# Patient Record
Sex: Female | Born: 1975 | Race: White | Hispanic: No | Marital: Married | Smoking: Never smoker
Health system: Southern US, Community
[De-identification: ages and names within clinical notes are randomized; demographics above are authoritative.]

## PROBLEM LIST (undated history)

## (undated) DIAGNOSIS — E782 Mixed hyperlipidemia: Secondary | ICD-10-CM

## (undated) DIAGNOSIS — E669 Obesity, unspecified: Secondary | ICD-10-CM

## (undated) DIAGNOSIS — N200 Calculus of kidney: Secondary | ICD-10-CM

## (undated) DIAGNOSIS — Z720 Tobacco use: Secondary | ICD-10-CM

## (undated) DIAGNOSIS — C50919 Malignant neoplasm of unspecified site of unspecified female breast: Secondary | ICD-10-CM

## (undated) DIAGNOSIS — I10 Essential (primary) hypertension: Secondary | ICD-10-CM

## (undated) DIAGNOSIS — F419 Anxiety disorder, unspecified: Secondary | ICD-10-CM

## (undated) HISTORY — DX: Tobacco use: Z72.0

## (undated) HISTORY — PX: BREAST LUMPECTOMY: SHX2

## (undated) HISTORY — DX: Essential (primary) hypertension: I10

## (undated) HISTORY — PX: KIDNEY STONE SURGERY: SHX686

## (undated) HISTORY — DX: Obesity, unspecified: E66.9

## (undated) HISTORY — DX: Calculus of kidney: N20.0

## (undated) HISTORY — DX: Mixed hyperlipidemia: E78.2

## (undated) HISTORY — DX: Malignant neoplasm of unspecified site of unspecified female breast: C50.919

## (undated) HISTORY — DX: Anxiety disorder, unspecified: F41.9

---

## 2016-05-12 DIAGNOSIS — I10 Essential (primary) hypertension: Secondary | ICD-10-CM | POA: Diagnosis not present

## 2016-05-12 DIAGNOSIS — G4700 Insomnia, unspecified: Secondary | ICD-10-CM | POA: Diagnosis not present

## 2016-05-12 DIAGNOSIS — F411 Generalized anxiety disorder: Secondary | ICD-10-CM | POA: Diagnosis not present

## 2016-05-12 DIAGNOSIS — Z79899 Other long term (current) drug therapy: Secondary | ICD-10-CM | POA: Diagnosis not present

## 2016-05-12 DIAGNOSIS — E784 Other hyperlipidemia: Secondary | ICD-10-CM | POA: Diagnosis not present

## 2017-05-28 DIAGNOSIS — N200 Calculus of kidney: Secondary | ICD-10-CM | POA: Diagnosis not present

## 2017-06-07 DIAGNOSIS — N201 Calculus of ureter: Secondary | ICD-10-CM | POA: Diagnosis not present

## 2017-06-07 DIAGNOSIS — N3000 Acute cystitis without hematuria: Secondary | ICD-10-CM | POA: Diagnosis not present

## 2017-06-07 DIAGNOSIS — R100 Acute abdomen: Secondary | ICD-10-CM | POA: Diagnosis not present

## 2017-07-08 DIAGNOSIS — E669 Obesity, unspecified: Secondary | ICD-10-CM | POA: Diagnosis not present

## 2017-07-08 DIAGNOSIS — F419 Anxiety disorder, unspecified: Secondary | ICD-10-CM | POA: Diagnosis not present

## 2017-07-08 DIAGNOSIS — I10 Essential (primary) hypertension: Secondary | ICD-10-CM | POA: Diagnosis not present

## 2017-07-08 DIAGNOSIS — Z6837 Body mass index (BMI) 37.0-37.9, adult: Secondary | ICD-10-CM | POA: Diagnosis not present

## 2017-08-09 DIAGNOSIS — F329 Major depressive disorder, single episode, unspecified: Secondary | ICD-10-CM | POA: Diagnosis not present

## 2017-08-09 DIAGNOSIS — E669 Obesity, unspecified: Secondary | ICD-10-CM | POA: Diagnosis not present

## 2017-08-09 DIAGNOSIS — I10 Essential (primary) hypertension: Secondary | ICD-10-CM | POA: Diagnosis not present

## 2017-08-09 DIAGNOSIS — F17200 Nicotine dependence, unspecified, uncomplicated: Secondary | ICD-10-CM | POA: Diagnosis not present

## 2017-11-15 DIAGNOSIS — N200 Calculus of kidney: Secondary | ICD-10-CM | POA: Insufficient documentation

## 2017-11-15 DIAGNOSIS — R319 Hematuria, unspecified: Secondary | ICD-10-CM | POA: Diagnosis not present

## 2017-11-15 DIAGNOSIS — N201 Calculus of ureter: Secondary | ICD-10-CM | POA: Diagnosis not present

## 2017-11-15 DIAGNOSIS — R109 Unspecified abdominal pain: Secondary | ICD-10-CM | POA: Diagnosis not present

## 2017-11-15 HISTORY — DX: Hematuria, unspecified: R31.9

## 2017-11-15 HISTORY — DX: Unspecified abdominal pain: R10.9

## 2017-11-15 HISTORY — DX: Calculus of kidney: N20.0

## 2017-11-22 DIAGNOSIS — Z01818 Encounter for other preprocedural examination: Secondary | ICD-10-CM | POA: Diagnosis not present

## 2017-11-22 DIAGNOSIS — N1330 Unspecified hydronephrosis: Secondary | ICD-10-CM | POA: Diagnosis not present

## 2017-11-22 DIAGNOSIS — N200 Calculus of kidney: Secondary | ICD-10-CM | POA: Diagnosis not present

## 2017-11-23 DIAGNOSIS — N200 Calculus of kidney: Secondary | ICD-10-CM | POA: Diagnosis not present

## 2017-11-23 DIAGNOSIS — N201 Calculus of ureter: Secondary | ICD-10-CM | POA: Diagnosis not present

## 2017-12-06 DIAGNOSIS — R8290 Unspecified abnormal findings in urine: Secondary | ICD-10-CM | POA: Diagnosis not present

## 2017-12-06 DIAGNOSIS — N201 Calculus of ureter: Secondary | ICD-10-CM | POA: Diagnosis not present

## 2017-12-06 DIAGNOSIS — N200 Calculus of kidney: Secondary | ICD-10-CM | POA: Diagnosis not present

## 2017-12-21 DIAGNOSIS — N200 Calculus of kidney: Secondary | ICD-10-CM | POA: Diagnosis not present

## 2018-01-05 DIAGNOSIS — N200 Calculus of kidney: Secondary | ICD-10-CM | POA: Diagnosis not present

## 2018-01-10 DIAGNOSIS — N200 Calculus of kidney: Secondary | ICD-10-CM | POA: Diagnosis not present

## 2018-01-10 DIAGNOSIS — E785 Hyperlipidemia, unspecified: Secondary | ICD-10-CM | POA: Diagnosis not present

## 2018-01-10 DIAGNOSIS — F329 Major depressive disorder, single episode, unspecified: Secondary | ICD-10-CM | POA: Diagnosis not present

## 2018-01-10 DIAGNOSIS — F419 Anxiety disorder, unspecified: Secondary | ICD-10-CM | POA: Diagnosis not present

## 2018-01-11 DIAGNOSIS — E785 Hyperlipidemia, unspecified: Secondary | ICD-10-CM | POA: Diagnosis not present

## 2018-02-18 DIAGNOSIS — Z6836 Body mass index (BMI) 36.0-36.9, adult: Secondary | ICD-10-CM | POA: Diagnosis not present

## 2018-02-18 DIAGNOSIS — E669 Obesity, unspecified: Secondary | ICD-10-CM | POA: Diagnosis not present

## 2018-03-11 DIAGNOSIS — G43109 Migraine with aura, not intractable, without status migrainosus: Secondary | ICD-10-CM | POA: Diagnosis not present

## 2018-04-11 DIAGNOSIS — N200 Calculus of kidney: Secondary | ICD-10-CM | POA: Diagnosis not present

## 2018-04-12 DIAGNOSIS — N200 Calculus of kidney: Secondary | ICD-10-CM | POA: Diagnosis not present

## 2018-05-18 DIAGNOSIS — N200 Calculus of kidney: Secondary | ICD-10-CM | POA: Diagnosis not present

## 2018-06-07 DIAGNOSIS — E669 Obesity, unspecified: Secondary | ICD-10-CM | POA: Diagnosis not present

## 2018-06-07 DIAGNOSIS — F322 Major depressive disorder, single episode, severe without psychotic features: Secondary | ICD-10-CM | POA: Diagnosis not present

## 2018-06-07 DIAGNOSIS — Z6836 Body mass index (BMI) 36.0-36.9, adult: Secondary | ICD-10-CM | POA: Diagnosis not present

## 2018-06-07 DIAGNOSIS — I10 Essential (primary) hypertension: Secondary | ICD-10-CM | POA: Diagnosis not present

## 2018-06-20 DIAGNOSIS — Z1231 Encounter for screening mammogram for malignant neoplasm of breast: Secondary | ICD-10-CM | POA: Diagnosis not present

## 2018-07-05 DIAGNOSIS — F329 Major depressive disorder, single episode, unspecified: Secondary | ICD-10-CM | POA: Diagnosis not present

## 2018-07-05 DIAGNOSIS — E669 Obesity, unspecified: Secondary | ICD-10-CM | POA: Diagnosis not present

## 2018-07-05 DIAGNOSIS — Z6836 Body mass index (BMI) 36.0-36.9, adult: Secondary | ICD-10-CM | POA: Diagnosis not present

## 2018-09-21 DIAGNOSIS — J329 Chronic sinusitis, unspecified: Secondary | ICD-10-CM | POA: Diagnosis not present

## 2018-12-12 DIAGNOSIS — R079 Chest pain, unspecified: Secondary | ICD-10-CM | POA: Diagnosis not present

## 2018-12-12 DIAGNOSIS — E785 Hyperlipidemia, unspecified: Secondary | ICD-10-CM | POA: Diagnosis not present

## 2018-12-12 DIAGNOSIS — F411 Generalized anxiety disorder: Secondary | ICD-10-CM | POA: Diagnosis not present

## 2018-12-12 DIAGNOSIS — Z87891 Personal history of nicotine dependence: Secondary | ICD-10-CM | POA: Diagnosis not present

## 2018-12-12 DIAGNOSIS — Z720 Tobacco use: Secondary | ICD-10-CM | POA: Diagnosis not present

## 2019-02-14 DIAGNOSIS — R0789 Other chest pain: Secondary | ICD-10-CM | POA: Diagnosis not present

## 2019-02-14 DIAGNOSIS — F419 Anxiety disorder, unspecified: Secondary | ICD-10-CM | POA: Diagnosis not present

## 2019-02-15 DIAGNOSIS — R0789 Other chest pain: Secondary | ICD-10-CM | POA: Diagnosis not present

## 2019-05-23 DIAGNOSIS — F331 Major depressive disorder, recurrent, moderate: Secondary | ICD-10-CM | POA: Diagnosis not present

## 2019-05-23 DIAGNOSIS — F419 Anxiety disorder, unspecified: Secondary | ICD-10-CM | POA: Diagnosis not present

## 2019-06-26 DIAGNOSIS — M546 Pain in thoracic spine: Secondary | ICD-10-CM | POA: Diagnosis not present

## 2019-06-26 DIAGNOSIS — T148XXA Other injury of unspecified body region, initial encounter: Secondary | ICD-10-CM | POA: Diagnosis not present

## 2019-07-17 DIAGNOSIS — H9209 Otalgia, unspecified ear: Secondary | ICD-10-CM | POA: Diagnosis not present

## 2019-07-17 DIAGNOSIS — R599 Enlarged lymph nodes, unspecified: Secondary | ICD-10-CM | POA: Diagnosis not present

## 2019-07-17 DIAGNOSIS — H6690 Otitis media, unspecified, unspecified ear: Secondary | ICD-10-CM | POA: Diagnosis not present

## 2019-07-20 DIAGNOSIS — Z1231 Encounter for screening mammogram for malignant neoplasm of breast: Secondary | ICD-10-CM | POA: Diagnosis not present

## 2019-07-20 DIAGNOSIS — Z1239 Encounter for other screening for malignant neoplasm of breast: Secondary | ICD-10-CM | POA: Diagnosis not present

## 2019-07-21 DIAGNOSIS — Z20828 Contact with and (suspected) exposure to other viral communicable diseases: Secondary | ICD-10-CM | POA: Diagnosis not present

## 2019-08-14 DIAGNOSIS — K30 Functional dyspepsia: Secondary | ICD-10-CM | POA: Diagnosis not present

## 2019-08-14 DIAGNOSIS — R1013 Epigastric pain: Secondary | ICD-10-CM | POA: Diagnosis not present

## 2019-08-14 DIAGNOSIS — I10 Essential (primary) hypertension: Secondary | ICD-10-CM | POA: Diagnosis not present

## 2019-08-18 DIAGNOSIS — Z20828 Contact with and (suspected) exposure to other viral communicable diseases: Secondary | ICD-10-CM | POA: Diagnosis not present

## 2019-08-25 DIAGNOSIS — Z0000 Encounter for general adult medical examination without abnormal findings: Secondary | ICD-10-CM | POA: Diagnosis not present

## 2019-08-25 DIAGNOSIS — N200 Calculus of kidney: Secondary | ICD-10-CM | POA: Diagnosis not present

## 2019-08-25 DIAGNOSIS — E785 Hyperlipidemia, unspecified: Secondary | ICD-10-CM | POA: Diagnosis not present

## 2019-08-25 DIAGNOSIS — Z87891 Personal history of nicotine dependence: Secondary | ICD-10-CM | POA: Diagnosis not present

## 2019-08-25 DIAGNOSIS — I10 Essential (primary) hypertension: Secondary | ICD-10-CM | POA: Diagnosis not present

## 2019-08-25 DIAGNOSIS — E669 Obesity, unspecified: Secondary | ICD-10-CM | POA: Diagnosis not present

## 2019-08-25 DIAGNOSIS — Z1331 Encounter for screening for depression: Secondary | ICD-10-CM | POA: Diagnosis not present

## 2019-08-29 DIAGNOSIS — Z20822 Contact with and (suspected) exposure to covid-19: Secondary | ICD-10-CM | POA: Diagnosis not present

## 2019-09-28 DIAGNOSIS — D225 Melanocytic nevi of trunk: Secondary | ICD-10-CM | POA: Diagnosis not present

## 2019-09-28 DIAGNOSIS — D2239 Melanocytic nevi of other parts of face: Secondary | ICD-10-CM | POA: Diagnosis not present

## 2019-09-28 DIAGNOSIS — L918 Other hypertrophic disorders of the skin: Secondary | ICD-10-CM | POA: Diagnosis not present

## 2019-09-28 DIAGNOSIS — D1801 Hemangioma of skin and subcutaneous tissue: Secondary | ICD-10-CM | POA: Diagnosis not present

## 2019-09-28 DIAGNOSIS — D485 Neoplasm of uncertain behavior of skin: Secondary | ICD-10-CM | POA: Diagnosis not present

## 2019-11-03 DIAGNOSIS — F411 Generalized anxiety disorder: Secondary | ICD-10-CM | POA: Diagnosis not present

## 2020-01-09 DIAGNOSIS — S39012A Strain of muscle, fascia and tendon of lower back, initial encounter: Secondary | ICD-10-CM | POA: Diagnosis not present

## 2020-03-12 DIAGNOSIS — F411 Generalized anxiety disorder: Secondary | ICD-10-CM | POA: Diagnosis not present

## 2020-03-13 DIAGNOSIS — E785 Hyperlipidemia, unspecified: Secondary | ICD-10-CM | POA: Diagnosis not present

## 2020-04-11 DIAGNOSIS — R112 Nausea with vomiting, unspecified: Secondary | ICD-10-CM | POA: Diagnosis not present

## 2020-04-11 DIAGNOSIS — N200 Calculus of kidney: Secondary | ICD-10-CM | POA: Diagnosis not present

## 2020-07-22 DIAGNOSIS — Z1239 Encounter for other screening for malignant neoplasm of breast: Secondary | ICD-10-CM | POA: Diagnosis not present

## 2020-07-22 DIAGNOSIS — Z1231 Encounter for screening mammogram for malignant neoplasm of breast: Secondary | ICD-10-CM | POA: Diagnosis not present

## 2020-07-23 DIAGNOSIS — Z1151 Encounter for screening for human papillomavirus (HPV): Secondary | ICD-10-CM | POA: Diagnosis not present

## 2020-07-23 DIAGNOSIS — N938 Other specified abnormal uterine and vaginal bleeding: Secondary | ICD-10-CM | POA: Diagnosis not present

## 2020-07-23 DIAGNOSIS — Z3202 Encounter for pregnancy test, result negative: Secondary | ICD-10-CM | POA: Diagnosis not present

## 2020-07-23 DIAGNOSIS — Z853 Personal history of malignant neoplasm of breast: Secondary | ICD-10-CM | POA: Diagnosis not present

## 2020-07-23 DIAGNOSIS — Z01419 Encounter for gynecological examination (general) (routine) without abnormal findings: Secondary | ICD-10-CM | POA: Diagnosis not present

## 2020-07-28 DIAGNOSIS — U071 COVID-19: Secondary | ICD-10-CM | POA: Diagnosis not present

## 2020-09-24 DIAGNOSIS — F411 Generalized anxiety disorder: Secondary | ICD-10-CM | POA: Diagnosis not present

## 2020-10-28 DIAGNOSIS — M25511 Pain in right shoulder: Secondary | ICD-10-CM | POA: Diagnosis not present

## 2020-11-13 DIAGNOSIS — F411 Generalized anxiety disorder: Secondary | ICD-10-CM | POA: Diagnosis not present

## 2020-11-13 DIAGNOSIS — F410 Panic disorder [episodic paroxysmal anxiety] without agoraphobia: Secondary | ICD-10-CM | POA: Diagnosis not present

## 2020-12-31 DIAGNOSIS — M25552 Pain in left hip: Secondary | ICD-10-CM | POA: Diagnosis not present

## 2020-12-31 DIAGNOSIS — S76012A Strain of muscle, fascia and tendon of left hip, initial encounter: Secondary | ICD-10-CM | POA: Diagnosis not present

## 2021-01-09 ENCOUNTER — Encounter: Payer: Self-pay | Admitting: *Deleted

## 2021-01-09 ENCOUNTER — Encounter: Payer: Self-pay | Admitting: Cardiology

## 2021-01-10 ENCOUNTER — Other Ambulatory Visit: Payer: Self-pay

## 2021-01-10 ENCOUNTER — Ambulatory Visit (INDEPENDENT_AMBULATORY_CARE_PROVIDER_SITE_OTHER): Payer: BC Managed Care – PPO

## 2021-01-10 ENCOUNTER — Encounter: Payer: Self-pay | Admitting: Cardiology

## 2021-01-10 ENCOUNTER — Ambulatory Visit: Payer: BC Managed Care – PPO | Admitting: Cardiology

## 2021-01-10 VITALS — BP 126/80 | HR 66 | Ht 63.0 in | Wt 219.8 lb

## 2021-01-10 DIAGNOSIS — R079 Chest pain, unspecified: Secondary | ICD-10-CM

## 2021-01-10 DIAGNOSIS — E782 Mixed hyperlipidemia: Secondary | ICD-10-CM

## 2021-01-10 DIAGNOSIS — R011 Cardiac murmur, unspecified: Secondary | ICD-10-CM | POA: Insufficient documentation

## 2021-01-10 DIAGNOSIS — R0609 Other forms of dyspnea: Secondary | ICD-10-CM

## 2021-01-10 DIAGNOSIS — R0600 Dyspnea, unspecified: Secondary | ICD-10-CM

## 2021-01-10 DIAGNOSIS — R002 Palpitations: Secondary | ICD-10-CM | POA: Diagnosis not present

## 2021-01-10 DIAGNOSIS — E669 Obesity, unspecified: Secondary | ICD-10-CM | POA: Insufficient documentation

## 2021-01-10 DIAGNOSIS — I10 Essential (primary) hypertension: Secondary | ICD-10-CM

## 2021-01-10 HISTORY — DX: Chest pain, unspecified: R07.9

## 2021-01-10 HISTORY — DX: Obesity, unspecified: E66.9

## 2021-01-10 HISTORY — DX: Cardiac murmur, unspecified: R01.1

## 2021-01-10 HISTORY — DX: Other forms of dyspnea: R06.09

## 2021-01-10 HISTORY — DX: Dyspnea, unspecified: R06.00

## 2021-01-10 NOTE — Addendum Note (Signed)
Addended by: Berniece Salines on: 01/10/2021 10:00 AM   Modules accepted: Orders

## 2021-01-10 NOTE — Patient Instructions (Signed)
Medication Instructions:  Your physician recommends that you continue on your current medications as directed. Please refer to the Current Medication list given to you today.  *If you need a refill on your cardiac medications before your next appointment, please call your pharmacy*   Lab Work: None If you have labs (blood work) drawn today and your tests are completely normal, you will receive your results only by: Marland Kitchen MyChart Message (if you have MyChart) OR . A paper copy in the mail If you have any lab test that is abnormal or we need to change your treatment, we will call you to review the results.   Testing/Procedures: A zio monitor was ordered today. It will remain on for 7 days. You will then return monitor and event diary in provided box. It takes 1-2 weeks for report to be downloaded and returned to [place]. We will call you with the results. If monitor falls off or has orange flashing light, please call Zio for further instructions.    Your physician has requested that you have a stress echocardiogram. For further information please visit [email or web address]. Please follow instruction sheet as given.    Follow-Up: At [facility], you and your health needs are our priority.  As part of our continuing mission to provide you with exceptional heart care, we have created designated Provider Care Teams.  These Care Teams include your primary Cardiologist (physician) and Advanced Practice Providers (APPs -  Physician Assistants and Nurse Practitioners) who all work together to provide you with the care you need, when you need it.  We recommend signing up for the patient portal called "MyChart".  Sign up information is provided on this After Visit Summary.  MyChart is used to connect with patients for Virtual Visits (Telemedicine).  Patients are able to view lab/test results, encounter notes, upcoming appointments, etc.  Non-urgent messages can be sent to your provider as well.   To learn  more about what you can do with MyChart, go to [email or web address].    Your next appointment:   3 month(s)  The format for your next appointment:   In Person  Provider:   Berniece Salines, DO   Other Instructions   Exercise Stress Echocardiogram An exercise stress echocardiogram is a test to check how well your heart is working. This test uses sound waves (ultrasound) and a computer to make images of your heart before and after exercise. Ultrasound images that are taken before you exercise (resting echocardiogram) will show how much blood is getting to your heart muscle and how well your heart muscle and heart valves are functioning. During the next part of this test, you will walk on a treadmill or ride a stationary bicycle to see how exercise affects your heart. While you exercise, the electrical activity of your heart will be monitored with an electrocardiogram (ECG). Your blood pressure will also be monitored. You may have this test if you have:  Chest pain or other symptoms of a heart problem.  Recently had a heart attack or heart surgery.  Heart valve problems.  A condition that causes narrowing of the blood vessels that supply your heart (coronary artery disease).  A high risk of heart disease and are starting a new exercise program.  A high risk of heart disease and need to have major surgery.  Heart arrhythmia (abnormal heart rhythm) problems.  Heart failure problems. Tell a health care provider about:  Any allergies you have.  All medicines you are taking, including vitamins, herbs,  eye drops, creams, and over-the-counter medicines.  Any problems you or family members have had with anesthetic medicines.  Any surgeries you have had.  Any blood disorders you have.  Any medical conditions you have.  Whether you are pregnant or may be pregnant. What are the risks? Generally, this is a safe test. However, problems may occur, including:  Chest  pain.  Dizziness or light-headedness.  Shortness of breath.  Increased or irregular heartbeat (palpitations).  Nausea or vomiting.  Heart attack. This is very rare. What happens before the test? Medicines  Ask your health care provider about changing or stopping your regular medicines. This is especially important if you are taking diabetes medicines or blood thinners.  If you use an inhaler, bring it with you to the test. General instructions  Wear loose, comfortable clothing and walking shoes.  Follow instructions from your health care provider about eating or drinking restrictions. You may be asked to avoid all forms of caffeine for 24 hours before your test, or as told by your health care provider.  Do not use any products that contain nicotine or tobacco for 4 hours before the test, or as told by your health care provider. These products include cigarettes, chewing tobacco, and vaping devices, such as e-cigarettes. If you need help quitting, ask your health care provider. What happens during the test?  You will take off your clothes from the waist up and put on a hospital gown.  Electrodes or electrocardiogram (ECG) patches may be placed on your chest. The electrodes or patches are then connected to a device that monitors your heart rate and rhythm.  A blood pressure cuff will be placed on your arm.  You will lie down on a table for an ultrasound exam before you exercise. A gel will be applied to your chest to help sound waves pass through your skin.  A handheld device, called a transducer, will be pressed against your chest and moved over your heart. The transducer produces sound waves that travel to your heart and bounce back (or "echo" back) to the transducer. These sound waves will be captured in real-time and changed into images of your heart that can be viewed on a video monitor. The images will be recorded on a computer and reviewed by your health care provider.  Once  the ultrasound is complete, you will start exercising by walking on a treadmill or pedaling a stationary bicycle.  Your blood pressure and heart rhythm will be monitored while you exercise.  The exercise will gradually get harder or faster.  You will exercise until: ? Your heart reaches a target level. ? You are too tired to continue. ? You cannot continue because of chest pain, weakness, or dizziness.  You will have another ultrasound exam immediately after you stop exercising. The procedure may vary among health care providers and hospitals.   What can I expect after the test? After your test, it is common to have:  Mild soreness.  Mild fatigue. Your heart rate and blood pressure will be monitored until they return to your normal levels. You should not have any new symptoms after this test. Follow these instructions at home:  After your stress test, you should be able to return to your normal activities and diet.  Take over-the-counter and prescription medicines only as told by your health care provider.  Keep all follow-up visits. This is important.  It is up to you to get the results of your test. Ask your health  care provider, or the department that is doing the test, when your results will be ready. Contact a health care provider if you:  Feel dizzy or light-headed.  Have a fast or irregular heartbeat.  Have nausea or vomiting.  Have a headache.  Feel short of breath. Get help right away if you:  Develop pain or pressure: ? In your chest. ? In your jaw or neck. ? Between your shoulder blades. ? Radiating down your left arm.  Faint.  Have trouble breathing. These symptoms may represent a serious problem that is an emergency. Do not wait to see if the symptoms will go away. Get medical help right away. Call your local emergency services (911 in the [place] Do not drive yourself to the hospital. Summary  An exercise stress echocardiogram is a test that uses  ultrasound to check how well your heart works before and after exercise.  Before the test, follow instructions from your health care provider about stopping medicines and avoiding caffeine, nicotine and tobacco, and certain foods and drinks.  During the test, your blood pressure and heart rhythm will be monitored while you exercise on a treadmill or stationary bicycle. This information is not intended to replace advice given to you by your health care provider. Make sure you discuss any questions you have with your health care provider. Document Revised: 03/19/2020 Document Reviewed: 03/19/2020 Elsevier Patient Education  2021 [employer].

## 2021-01-10 NOTE — Progress Notes (Signed)
Cardiology Office Note:    Date:  01/10/2021   ID:  WEI NEWBROUGH, DOB 1976/04/06, MRN [id number]  PCP:  Jaclynn Major, NP  Cardiologist:  Berniece Salines, DO  Electrophysiologist:  None   Referring MD: Jaclynn Major, NP   " I have been experiencing chest pain and palpitations"  History of Present Illness:    Kristen Russell is a 45 y.o. female with a hx of breast cancer at the age of 74, and status post right lumpectomy and radiation, hypertension, hyperlipidemia, obesity, former smoker quit November 2021 after over many years of smoking, family history of premature coronary artery disease in her dad is here today to be evaluated for chest discomfort.  Patient tells me she has been experiencing intermittent left-sided chest pain.  She notes that it feels like someone is pressing on her chest.  She said the last for few minutes and then resolves on its own.  She notes that at times this is a sharp pain.  She has no recollection of radiation.  She does have associated shortness of breath.  In addition she has intermittent abrupt onset of fast heartbeat which last for few minutes and then resolved.  Recently she has been experiencing lightheadedness with these episodes.  Past Medical History:  Diagnosis Date  . Anxiety   . Breast cancer ([place])   . Calculus of kidney   . Hematuria 11/15/2017  . Hypertension   . Mixed hyperlipidemia   . Nephrolithiasis 11/15/2017  . Obesity   . Right flank pain 11/15/2017  . Tobacco abuse     Past Surgical History:  Procedure Laterality Date  . BREAST LUMPECTOMY Right   . CESAREAN SECTION     X2  . KIDNEY STONE SURGERY     X2    Current Medications: Current Meds  Medication Sig  . atenolol (TENORMIN) 25 MG tablet Take 12.5 mg by mouth daily.  . clonazePAM (KLONOPIN) 0.5 MG tablet Take 0.5 mg by mouth 2 (two) times daily as needed for anxiety.  Stasia Cavalier (EUCRISA) 2 % OINT Apply 1 application topically 2 (two) times daily.  .  cyclobenzaprine (FLEXERIL) 5 MG tablet Take 5 mg by mouth 2 (two) times daily as needed for muscle spasms.  . hydrochlorothiazide (MICROZIDE) 12.5 MG capsule Take 12.5 mg by mouth daily.  Marland Kitchen omeprazole (PRILOSEC) 20 MG capsule Take 20 mg by mouth daily.  . pimecrolimus (ELIDEL) 1 % cream Apply 1 application topically 2 (two) times daily.  . rosuvastatin (CRESTOR) 10 MG tablet Take 10 mg by mouth at bedtime.  . Semaglutide-Weight Management (WEGOVY) 1 MG/0.5ML SOAJ Inject 0.5 mLs into the skin once a week.  . sertraline (ZOLOFT) 100 MG tablet Take 100 mg by mouth daily.     Allergies:   Penicillin g   Social History   Socioeconomic History  . Marital status: Married    Spouse name: Not on file  . Number of children: Not on file  . Years of education: Not on file  . Highest education level: Not on file  Occupational History  . Not on file  Tobacco Use  . Smoking status: Not on file  . Smokeless tobacco: Not on file  Substance and Sexual Activity  . Alcohol use: Not on file  . Drug use: Not on file  . Sexual activity: Not on file  Other Topics Concern  . Not on file  Social History Narrative  . Not on file   Social Determinants of Health  Financial Resource Strain: Not on file  Food Insecurity: Not on file  Transportation Needs: Not on file  Physical Activity: Not on file  Stress: Not on file  Social Connections: Not on file     Family History: The patient's family history is not on file.  ROS:   Review of Systems  Constitution: Negative for decreased appetite, fever and weight gain.  HENT: Negative for congestion, ear discharge, hoarse voice and sore throat.   Eyes: Negative for discharge, redness, vision loss in right eye and visual halos.  Cardiovascular: Reports chest pain, dyspnea on exertion and palpitation.  Negative for, leg swelling, orthopnea. Respiratory: Negative for cough, hemoptysis, shortness of breath and snoring.   Endocrine: Negative for heat  intolerance and polyphagia.  Hematologic/Lymphatic: Negative for bleeding problem. Does not bruise/bleed easily.  Skin: Negative for flushing, nail changes, rash and suspicious lesions.  Musculoskeletal: Negative for arthritis, joint pain, muscle cramps, myalgias, neck pain and stiffness.  Gastrointestinal: Negative for abdominal pain, bowel incontinence, diarrhea and excessive appetite.  Genitourinary: Negative for decreased libido, genital sores and incomplete emptying.  Neurological: Negative for brief paralysis, focal weakness, headaches and loss of balance.  Psychiatric/Behavioral: Negative for altered mental status, depression and suicidal ideas.  Allergic/Immunologic: Negative for HIV exposure and persistent infections.    EKGs/Labs/Other Studies Reviewed:    The following studies were reviewed today:   EKG:  The ekg ordered today demonstrates sinus rhythm, heart rate 67 bpm.  Recent Labs: No results found for requested labs within last 8760 hours.  Recent Lipid Panel No results found for: CHOL, TRIG, HDL, CHOLHDL, VLDL, LDLCALC, LDLDIRECT  Physical Exam:    VS:  BP 126/80 (BP Location: Left Arm)   Pulse 66   Ht 5\' 3"  (1.6 m)   Wt 219 lb 12.8 oz (99.7 kg)   SpO2 98%   BMI 38.94 kg/m     Wt Readings from Last 3 Encounters:  01/10/21 219 lb 12.8 oz (99.7 kg)  12/31/20 218 lb (98.9 kg)     GEN: Well nourished, well developed in no acute distress HEENT: Normal NECK: No JVD; No carotid bruits LYMPHATICS: No lymphadenopathy CARDIAC: S1S2 noted,RRR, 3/6 holosystolic murmurs, rubs, gallops RESPIRATORY:  Clear to auscultation without rales, wheezing or rhonchi  ABDOMEN: Soft, non-tender, non-distended, +bowel sounds, no guarding. EXTREMITIES: No edema, No cyanosis, no clubbing MUSCULOSKELETAL:  No deformity  SKIN: Warm and dry NEUROLOGIC:  Alert and oriented x 3, non-focal PSYCHIATRIC:  Normal affect, good insight  ASSESSMENT:    1. Chest pain of uncertain etiology    2. Dyspnea on exertion   3. Murmur   4. Mixed hyperlipidemia   5. Hypertension, unspecified type   6. Obesity (BMI 30-39.9)    PLAN:    The patient does have intermediate risk for coronary artery disease (hyperlipidemia, hypertension, obesity, former smoker, premature family history of CAD) therefore like to proceed with any evaluation in this patient a stress echocardiogram will be an appropriate testing at this time.  Educated patient about the test and she is agreeable to proceed with this.  With her baseline echocardiogram will be able to understand if there is any significant valvular pathology as well.   I would like to rule out a cardiovascular etiology of this palpitation, therefore at this time I would like to placed a zio patch for 7 days.   The patient understands the need to lose weight with diet and exercise. We have discussed specific strategies for this.  Hyperlipidemia - continue  with current statin medication.  Blood pressure is acceptable, continue with current antihypertensive regimen.  Once these testing have been performed amd reviewed further reccomendations will be made. For now, I do reccomend that the patient goes to the nearest ED if  symptoms recur. Risk factors for coronary artery disease   The patient is in agreement with the above plan. The patient left the office in stable condition.  The patient will follow up in 3 months or sooner if needed.   Medication Adjustments/Labs and Tests Ordered: Current medicines are reviewed at length with the patient today.  Concerns regarding medicines are outlined above.  No orders of the defined types were placed in this encounter.  No orders of the defined types were placed in this encounter.   There are no Patient Instructions on file for this visit.   Adopting a Healthy Lifestyle.  Know what a healthy weight is for you (roughly BMI <25) and aim to maintain this   Aim for 7+ servings of fruits and vegetables  daily   65-80+ fluid ounces of water or unsweet tea for healthy kidneys   Limit to max 1 drink of alcohol per day; avoid smoking/tobacco   Limit animal fats in diet for cholesterol and heart health - choose grass fed whenever available   Avoid highly processed foods, and foods high in saturated/trans fats   Aim for low stress - take time to unwind and care for your mental health   Aim for 150 min of moderate intensity exercise weekly for heart health, and weights twice weekly for bone health   Aim for 7-9 hours of sleep daily   When it comes to diets, agreement about the perfect plan isnt easy to find, even among the experts. Experts at the [employer] developed an idea known as the Healthy Eating Plate. Just imagine a plate divided into logical, healthy portions.   The emphasis is on diet quality:   Load up on vegetables and fruits - one-half of your plate: Aim for color and variety, and remember that potatoes dont count.   Go for whole grains - one-quarter of your plate: Whole wheat, barley, wheat berries, quinoa, oats, Buerkle rice, and foods made with them. If you want pasta, go with whole wheat pasta.   Protein power - one-quarter of your plate: Fish, chicken, beans, and nuts are all healthy, versatile protein sources. Limit red meat.   The diet, however, does go beyond the plate, offering a few other suggestions.   Use healthy plant oils, such as olive, canola, soy, corn, sunflower and peanut. Check the labels, and avoid partially hydrogenated oil, which have unhealthy trans fats.   If youre thirsty, drink water. Coffee and tea are good in moderation, but skip sugary drinks and limit milk and dairy products to one or two daily servings.   The type of carbohydrate in the diet is more important than the amount. Some sources of carbohydrates, such as vegetables, fruits, whole grains, and beans-are healthier than others.   Finally, stay  active  Signed, Berniece Salines, DO  01/10/2021 9:19 AM    [place]

## 2021-01-22 DIAGNOSIS — I472 Ventricular tachycardia: Secondary | ICD-10-CM | POA: Diagnosis not present

## 2021-01-23 ENCOUNTER — Telehealth (INDEPENDENT_AMBULATORY_CARE_PROVIDER_SITE_OTHER): Payer: BC Managed Care – PPO | Admitting: Cardiology

## 2021-01-23 ENCOUNTER — Encounter: Payer: Self-pay | Admitting: Cardiology

## 2021-01-23 VITALS — BP 124/86 | HR 60 | Ht 63.0 in | Wt 220.0 lb

## 2021-01-23 DIAGNOSIS — R0600 Dyspnea, unspecified: Secondary | ICD-10-CM | POA: Diagnosis not present

## 2021-01-23 DIAGNOSIS — I10 Essential (primary) hypertension: Secondary | ICD-10-CM | POA: Diagnosis not present

## 2021-01-23 DIAGNOSIS — R0609 Other forms of dyspnea: Secondary | ICD-10-CM

## 2021-01-23 DIAGNOSIS — E782 Mixed hyperlipidemia: Secondary | ICD-10-CM

## 2021-01-23 DIAGNOSIS — I472 Ventricular tachycardia: Secondary | ICD-10-CM | POA: Diagnosis not present

## 2021-01-23 DIAGNOSIS — I4729 Other ventricular tachycardia: Secondary | ICD-10-CM

## 2021-01-23 DIAGNOSIS — E669 Obesity, unspecified: Secondary | ICD-10-CM

## 2021-01-23 NOTE — Progress Notes (Signed)
Virtual Visit via Video Note   This visit type was conducted due to national recommendations for restrictions regarding the COVID-19 Pandemic (e.g. social distancing) in an effort to limit this patient's exposure and mitigate transmission in our community.  Due to her co-morbid illnesses, this patient is at least at moderate risk for complications without adequate follow up.  This format is felt to be most appropriate for this patient at this time.  All issues noted in this document were discussed and addressed.  A limited physical exam was performed with this format.  Please refer to the patient's chart for her consent to telehealth for [facility].    Date:  01/23/2021   ID:  Kristen Russell, DOB 06-Sep-1975, MRN [id number]  Patient Location: Other:  At work -I connected with the patient on January 23, 2021 by video on (date)  by a  ) video enabled telemedicine application and verified that I am speaking with the correct person using two identifiers.  Provider Location: Office/Clinic  PCP:  Kristen Major, NP  Cardiologist:  Kristen Salines, DO  Electrophysiologist:  None   Evaluation Performed:  Follow-Up Visit  Chief Complaint:  " I am just nervous"  History of Present Illness:    Kristen Russell is a 45 y.o. female with  breast cancer at the age of 53, and status post right lumpectomy and radiation, hypertension, hyperlipidemia, obesity, former smoker quit November 2021 after over many years of smoking, family history of premature coronary artery disease in her dad.  I saw the patient on January 10, 2021 at that time she reported she had been experiencing chest discomfort.  I recommended that she undergo a stress echo as well as placed a monitor on the patient with palpitation. I requested to talk to the patient virtual visit because she has had some episodes of nonsustained ventricular tachycardia on her monitor.  This was symptomatic.  In the meantime her PCP had stopped her hydrochlorothiazide  and increase her atenolol. Today she still is experiencing chest discomfort as well as dyspnea on exertion.  But she has not had much palpitation.   The patient does not have symptoms concerning for COVID-19 infection (fever, chills, cough, or new shortness of breath).    Past Medical History:  Diagnosis Date   Anxiety    Breast cancer ([place])    Calculus of kidney    Hematuria 11/15/2017   Hypertension    Mixed hyperlipidemia    Nephrolithiasis 11/15/2017   Obesity    Right flank pain 11/15/2017   Tobacco abuse    Past Surgical History:  Procedure Laterality Date   BREAST LUMPECTOMY Right    CESAREAN SECTION     X2   KIDNEY STONE SURGERY     X2     Current Meds  Medication Sig   atenolol (TENORMIN) 25 MG tablet Take 25 mg by mouth daily.   clonazePAM (KLONOPIN) 0.5 MG tablet Take 0.5 mg by mouth 2 (two) times daily as needed for anxiety.   Crisaborole (EUCRISA) 2 % OINT Apply 1 application topically 2 (two) times daily.   cyclobenzaprine (FLEXERIL) 5 MG tablet Take 5 mg by mouth 2 (two) times daily as needed for muscle spasms.   omeprazole (PRILOSEC) 20 MG capsule Take 20 mg by mouth daily.   pimecrolimus (ELIDEL) 1 % cream Apply 1 application topically 2 (two) times daily.   rosuvastatin (CRESTOR) 10 MG tablet Take 10 mg by mouth at bedtime.   Semaglutide-Weight Management (WEGOVY)  1 MG/0.5ML SOAJ Inject 0.5 mLs into the skin once a week.   sertraline (ZOLOFT) 100 MG tablet Take 100 mg by mouth daily.     Allergies:   Penicillin g   Social History   Tobacco Use   Smoking status: Never   Smokeless tobacco: Never  Substance Use Topics   Alcohol use: Never   Drug use: Never     Family Hx: The patient's family history is not on file.  ROS:   Please see the history of present illness.    Review of Systems  Constitution: Negative for decreased appetite, fever and weight gain.  HENT: Negative for congestion, ear discharge, hoarse voice and sore throat.   Eyes:  Negative for discharge, redness, vision loss in right eye and visual halos.  Cardiovascular: Reports chest pain, dyspnea on exertion.  Negative for leg swelling, orthopnea and palpitations.  Respiratory: Negative for cough, hemoptysis, shortness of breath and snoring.   Endocrine: Negative for heat intolerance and polyphagia.  Hematologic/Lymphatic: Negative for bleeding problem. Does not bruise/bleed easily.  Skin: Negative for flushing, nail changes, rash and suspicious lesions.  Musculoskeletal: Negative for arthritis, joint pain, muscle cramps, myalgias, neck pain and stiffness.  Gastrointestinal: Negative for abdominal pain, bowel incontinence, diarrhea and excessive appetite.  Genitourinary: Negative for decreased libido, genital sores and incomplete emptying.  Neurological: Negative for brief paralysis, focal weakness, headaches and loss of balance.  Psychiatric/Behavioral: Negative for altered mental status, depression and suicidal ideas.  Allergic/Immunologic: Negative for HIV exposure and persistent infections.     Prior CV studies:   The following studies were reviewed today:  ZIO monitor Patch Wear Time:  6 days and 21 hours starting January 13, 2021. Indication: Palpitations   Patient had a minimum HR of 48 bpm, maximum HR of 169 bpm, and average HR of 67 bpm.   Predominant underlying rhythm was Sinus Rhythm.   1 run of Ventricular Tachycardia occurred lasting 16.3 secs with a maximal rate of 169 bpm (average 138 bpm).   Premature atrial complexes were rare. Premature ventricular complexes were rare.   Symptoms associated with nonsustained ventricular tachycardia.   No pauses, no supraventricular tachycardia and no atrial fibrillation noted.   Conclusion: This study is remarkable for symptomatic nonsustained ventricular tachycardia.  Labs/Other Tests and Data Reviewed:    EKG:  No ECG reviewed.  Recent Labs: No results found for requested labs within last 8760  hours.   Recent Lipid Panel No results found for: CHOL, TRIG, HDL, CHOLHDL, LDLCALC, LDLDIRECT  Wt Readings from Last 3 Encounters:  01/23/21 220 lb (99.8 kg)  01/10/21 219 lb 12.8 oz (99.7 kg)  12/31/20 218 lb (98.9 kg)     Objective:    Vital Signs:  BP 124/86 (BP Location: Right Arm, Patient Position: Sitting, Cuff Size: Normal)   Pulse 60   Ht 5\' 3"  (1.6 m)   Wt 220 lb (99.8 kg)   BMI 38.97 kg/m    Physical exam not performed  ASSESSMENT & PLAN:    Chest pain Dyspnea on exertion Mixed hyperlipidemia Obesity Hypertension Nonsustained ventricular tachycardia  We talked about her monitor findings showing evidence of symptomatic nonsustained ventricular tachycardia.  Her PCP has increased her atenolol.  The symptoms chest pain is concerning, this patient does have intermediate risk for coronary artery disease and at this time I would like to pursue an ischemic evaluation in this patient.  Shared decision a coronary CTA at this time is appropriate.  I have discussed with  the patient about the testing.  The patient has no IV contrast allergy and is agreeable to proceed with this test.  Blood pressure acceptable no changes will be made.  The patient understands the need to lose weight with diet and exercise. We have discussed specific strategies for this.  COVID-19 Education: The signs and symptoms of COVID-19 were discussed with the patient and how to seek care for testing (follow up with PCP or arrange E-visit).  The importance of social distancing was discussed today.  Time:   Today, I have spent 10 minutes with the patient with telehealth technology discussing the above problems.     Medication Adjustments/Labs and Tests Ordered: Current medicines are reviewed at length with the patient today.  Concerns regarding medicines are outlined above.   Tests Ordered: Orders Placed This Encounter  Procedures   CT CORONARY MORPH W/CTA COR W/SCORE W/CA W/CM &/OR WO/CM   Basic  metabolic panel    Medication Changes: No orders of the defined types were placed in this encounter.   Follow Up:  In Person in 6 month(s)  Signed, Kristen Salines, DO  01/23/2021 1:54 PM    [facility]

## 2021-01-23 NOTE — Patient Instructions (Signed)
Medication Instructions:  Your physician recommends that you continue on your current medications as directed. Please refer to the Current Medication list given to you today.  *If you need a refill on your cardiac medications before your next appointment, please call your pharmacy*   Lab Work: Your physician recommends that you return for lab work in: TODAY BMP If you have labs (blood work) drawn today and your tests are completely normal, you will receive your results only by: [place] (if you have MyChart) OR A paper copy in the mail If you have any lab test that is abnormal or we need to change your treatment, we will call you to review the results.   Testing/Procedures: Your cardiac CT will be scheduled at the below location:   [facility] [address] [place], [place] [address] [phone]  If scheduled at [facility], please arrive at the [facility] main entrance (entrance A) of [facility] 30 minutes prior to test start time. Proceed to the [facility] Radiology Department (first floor) to check-in and test prep.  Please follow these instructions carefully (unless otherwise directed):  On the Night Before the Test: Be sure to Drink plenty of water. Do not consume any caffeinated/decaffeinated beverages or chocolate 12 hours prior to your test. Do not take any antihistamines 12 hours prior to your test.  On the Day of the Test: Drink plenty of water until 1 hour prior to the test. Do not eat any food 4 hours prior to the test. You may take your regular medications prior to the test.  Take atenolol two hours prior to test. FEMALES- please wear underwire-free bra if available  After the Test: Drink plenty of water. After receiving IV contrast, you may experience a mild flushed feeling. This is normal. On occasion, you may experience a mild rash up to 24 hours after the test. This is not dangerous. If this occurs, you can take  Benadryl 25 mg and increase your fluid intake. If you experience trouble breathing, this can be serious. If it is severe call 911 IMMEDIATELY. If it is mild, please call our office. If you take any of these medications: Glipizide/Metformin, Avandament, Glucavance, please do not take 48 hours after completing test unless otherwise instructed.   Once we have confirmed authorization from your insurance company, we will call you to set up a date and time for your test. Based on how quickly your insurance processes prior authorizations requests, please allow up to 4 weeks to be contacted for scheduling your Cardiac CT appointment. Be advised that routine Cardiac CT appointments could be scheduled as many as 8 weeks after your provider has ordered it.  For non-scheduling related questions, please contact the cardiac imaging nurse navigator should you have any questions/concerns: Marchia Bond, Cardiac Imaging Nurse Navigator Gordy Clement, Cardiac Imaging Nurse Navigator [facility] Direct Office Dial: [phone]   For scheduling needs, including cancellations and rescheduling, please call [place], [phone].    Follow-Up: At [facility], you and your health needs are our priority.  As part of our continuing mission to provide you with exceptional heart care, we have created designated Provider Care Teams.  These Care Teams include your primary Cardiologist (physician) and Advanced Practice Providers (APPs -  Physician Assistants and Nurse Practitioners) who all work together to provide you with the care you need, when you need it.  We recommend signing up for the patient portal called "MyChart".  Sign  up information is provided on this After Visit Summary.  MyChart is used to connect with patients for Virtual Visits (Telemedicine).  Patients are able to view lab/test results, encounter notes, upcoming appointments, etc.  Non-urgent messages can be sent to your  provider as well.   To learn more about what you can do with MyChart, go to [email or web address].    Your next appointment:   6 month(s)  The format for your next appointment:   In Person  Provider:   Berniece Salines, DO   Other Instructions

## 2021-02-03 ENCOUNTER — Telehealth (HOSPITAL_COMMUNITY): Payer: Self-pay | Admitting: *Deleted

## 2021-02-03 NOTE — Telephone Encounter (Signed)
Reaching out to patient to offer assistance regarding upcoming cardiac imaging study; pt verbalizes understanding of appt date/time, parking situation and where to check in, pre-test NPO status and medications ordered, and verified current allergies; name and call back number provided for further questions should they arise  Gordy Clement RN Navigator Cardiac Imaging [facility] [phone] office [phone] cell  Pt to take her daily atenolol 2 hours prior to cardiac CT. Pt also aware to that is she takes her Klonopin for test preparation that she will need a driver.

## 2021-02-05 ENCOUNTER — Other Ambulatory Visit: Payer: Self-pay

## 2021-02-05 ENCOUNTER — Ambulatory Visit (HOSPITAL_COMMUNITY)
Admission: RE | Admit: 2021-02-05 | Discharge: 2021-02-05 | Disposition: A | Discharge status: Home / Self Care | Payer: BC Managed Care – PPO | Source: Ambulatory Visit | Attending: Cardiology | Admitting: Cardiology

## 2021-02-05 ENCOUNTER — Encounter (HOSPITAL_COMMUNITY): Payer: Self-pay

## 2021-02-05 DIAGNOSIS — R0600 Dyspnea, unspecified: Secondary | ICD-10-CM | POA: Diagnosis not present

## 2021-02-05 DIAGNOSIS — R079 Chest pain, unspecified: Secondary | ICD-10-CM | POA: Diagnosis not present

## 2021-02-05 MED ORDER — METOPROLOL TARTRATE 5 MG/5ML IV SOLN
INTRAVENOUS | Status: AC
  Filled 2021-02-05: qty 10

## 2021-02-05 MED ORDER — NITROGLYCERIN 0.4 MG SL SUBL
SUBLINGUAL_TABLET | SUBLINGUAL | Status: AC
  Administered 2021-02-05: 0.8 mg via SUBLINGUAL
  Filled 2021-02-05: qty 2

## 2021-02-05 MED ORDER — IOHEXOL 350 MG/ML SOLN
95.0000 mL | Freq: Once | INTRAVENOUS | Status: AC | PRN
  Administered 2021-02-05: 95 mL via INTRAVENOUS

## 2021-02-05 MED ORDER — NITROGLYCERIN 0.4 MG SL SUBL: 0.8000 mg | SUBLINGUAL_TABLET | Freq: Once | SUBLINGUAL | Status: AC

## 2021-02-05 NOTE — Progress Notes (Signed)
Patient tolerated CT well. Drank water after. Vital signs stable encourage to drink water throughout day.Reasons explained and verbalized understanding. Ambulated steady gait.  

## 2021-02-06 DIAGNOSIS — Z01419 Encounter for gynecological examination (general) (routine) without abnormal findings: Secondary | ICD-10-CM | POA: Diagnosis not present

## 2021-02-06 DIAGNOSIS — Z87891 Personal history of nicotine dependence: Secondary | ICD-10-CM | POA: Diagnosis not present

## 2021-02-06 DIAGNOSIS — Z853 Personal history of malignant neoplasm of breast: Secondary | ICD-10-CM | POA: Diagnosis not present

## 2021-02-06 DIAGNOSIS — R9389 Abnormal findings on diagnostic imaging of other specified body structures: Secondary | ICD-10-CM | POA: Diagnosis not present

## 2021-02-06 DIAGNOSIS — N854 Malposition of uterus: Secondary | ICD-10-CM | POA: Diagnosis not present

## 2021-02-06 DIAGNOSIS — N939 Abnormal uterine and vaginal bleeding, unspecified: Secondary | ICD-10-CM | POA: Diagnosis not present

## 2021-02-06 DIAGNOSIS — N83202 Unspecified ovarian cyst, left side: Secondary | ICD-10-CM | POA: Diagnosis not present

## 2021-02-06 DIAGNOSIS — D251 Intramural leiomyoma of uterus: Secondary | ICD-10-CM | POA: Diagnosis not present

## 2021-02-13 ENCOUNTER — Ambulatory Visit: Payer: BC Managed Care – PPO | Admitting: Cardiology

## 2021-02-20 ENCOUNTER — Other Ambulatory Visit (HOSPITAL_COMMUNITY): Payer: BC Managed Care – PPO

## 2021-03-17 DIAGNOSIS — G43009 Migraine without aura, not intractable, without status migrainosus: Secondary | ICD-10-CM | POA: Diagnosis not present

## 2021-05-02 DIAGNOSIS — F419 Anxiety disorder, unspecified: Secondary | ICD-10-CM | POA: Insufficient documentation

## 2021-05-02 DIAGNOSIS — C50919 Malignant neoplasm of unspecified site of unspecified female breast: Secondary | ICD-10-CM | POA: Insufficient documentation

## 2021-05-02 DIAGNOSIS — Z720 Tobacco use: Secondary | ICD-10-CM | POA: Insufficient documentation

## 2021-05-02 DIAGNOSIS — N200 Calculus of kidney: Secondary | ICD-10-CM | POA: Insufficient documentation

## 2021-05-06 ENCOUNTER — Other Ambulatory Visit: Payer: Self-pay

## 2021-05-06 ENCOUNTER — Ambulatory Visit: Payer: BC Managed Care – PPO | Admitting: Cardiology

## 2021-05-06 VITALS — BP 134/72 | HR 82 | Ht 63.0 in | Wt 212.8 lb

## 2021-05-06 DIAGNOSIS — I10 Essential (primary) hypertension: Secondary | ICD-10-CM | POA: Diagnosis not present

## 2021-05-06 DIAGNOSIS — E782 Mixed hyperlipidemia: Secondary | ICD-10-CM

## 2021-05-06 DIAGNOSIS — E669 Obesity, unspecified: Secondary | ICD-10-CM | POA: Diagnosis not present

## 2021-05-06 NOTE — Patient Instructions (Signed)
Medication Instructions:  Your physician recommends that you continue on your current medications as directed. Please refer to the Current Medication list given to you today.  *If you need a refill on your cardiac medications before your next appointment, please call your pharmacy*   Lab Work: None If you have labs (blood work) drawn today and your tests are completely normal, you will receive your results only by: [place] (if you have MyChart) OR A paper copy in the mail If you have any lab test that is abnormal or we need to change your treatment, we will call you to review the results.   Testing/Procedures: None   Follow-Up: At [facility], you and your health needs are our priority.  As part of our continuing mission to provide you with exceptional heart care, we have created designated Provider Care Teams.  These Care Teams include your primary Cardiologist (physician) and Advanced Practice Providers (APPs -  Physician Assistants and Nurse Practitioners) who all work together to provide you with the care you need, when you need it.  We recommend signing up for the patient portal called "MyChart".  Sign up information is provided on this After Visit Summary.  MyChart is used to connect with patients for Virtual Visits (Telemedicine).  Patients are able to view lab/test results, encounter notes, upcoming appointments, etc.  Non-urgent messages can be sent to your provider as well.   To learn more about what you can do with MyChart, go to [email or web address].    Your next appointment:    As needed  The format for your next appointment:   In Person  Provider:   Berniece Salines, DO [address], [place], [place] [address]    Other Instructions

## 2021-05-06 NOTE — Progress Notes (Signed)
Cardiology Office Note:    Date:  05/06/2021   ID:  HOLLIE WOJAHN, DOB 08/22/1975, MRN [id number]  PCP:  Jaclynn Major, NP  Cardiologist:  Berniece Salines, DO  Electrophysiologist:  None   Referring MD: Jaclynn Major, NP   Chief Complaint  Patient presents with   Follow-up    History of Present Illness:    Kristen Russell is a 45 y.o. 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  I last saw the patient on January 23, 2021 at that time we discussed her monitor results which did show evidence of NSVT and I recommended giving her chest discomfort as well the patient undergo a coronary CTA.  She did get her coronary CTA which did not show any evidence of coronary artery disease.  It was noted left ventricular diverticulum.  Today she tells me that she has not had any chest pain or shortness of breath.  Palpitations has resolved significantly.  Past Medical History:  Diagnosis Date   Anxiety    Breast cancer ([place])    Calculus of kidney    Chest pain of uncertain etiology 12/14/3147   Dyspnea on exertion 01/10/2021   Hematuria 11/15/2017   Hypertension    Mixed hyperlipidemia    Murmur 01/10/2021   Nephrolithiasis 11/15/2017   Obesity (BMI 30-39.9) 01/10/2021   Right flank pain 11/15/2017   Tobacco abuse     Past Surgical History:  Procedure Laterality Date   BREAST LUMPECTOMY Right    CESAREAN SECTION     X2   KIDNEY STONE SURGERY     X2    Current Medications: Current Meds  Medication Sig   atenolol (TENORMIN) 25 MG tablet Take 25 mg by mouth daily.   clonazePAM (KLONOPIN) 0.5 MG tablet Take 0.5 mg by mouth 2 (two) times daily as needed for anxiety.   Crisaborole (EUCRISA) 2 % OINT Apply 1 application topically 2 (two) times daily.   cyclobenzaprine (FLEXERIL) 5 MG tablet Take 5 mg  by mouth 2 (two) times daily as needed for muscle spasms.   NURTEC 75 MG TBDP Take 1 tablet by mouth every other day.   omeprazole (PRILOSEC) 20 MG capsule Take 20 mg by mouth daily.   pimecrolimus (ELIDEL) 1 % cream Apply 1 application topically 2 (two) times daily.   sertraline (ZOLOFT) 100 MG tablet Take 100 mg by mouth daily.     Allergies:   Penicillin g   Social History   Socioeconomic History   Marital status: Married    Spouse name: Not on file   Number of children: Not on file   Years of education: Not on file   Highest education level: Not on file  Occupational History   Not on file  Tobacco Use   Smoking status: Never   Smokeless tobacco: Never  Substance and Sexual Activity   Alcohol use: Never   Drug use: Never   Sexual activity: Yes  Other Topics Concern   Not on file  Social History Narrative   Not on file   Social Determinants of Health   Financial Resource Strain: Not on file  Food Insecurity: Not on file  Transportation Needs: Not on file  Physical Activity: Not on file  Stress: Not on file  Social Connections: Not on file     Family History: The  patient's family history is not on file.  ROS:   Review of Systems  Constitution: Negative for decreased appetite, fever and weight gain.  HENT: Negative for congestion, ear discharge, hoarse voice and sore throat.   Eyes: Negative for discharge, redness, vision loss in right eye and visual halos.  Cardiovascular: Negative for chest pain, dyspnea on exertion, leg swelling, orthopnea and palpitations.  Respiratory: Negative for cough, hemoptysis, shortness of breath and snoring.   Endocrine: Negative for heat intolerance and polyphagia.  Hematologic/Lymphatic: Negative for bleeding problem. Does not bruise/bleed easily.  Skin: Negative for flushing, nail changes, rash and suspicious lesions.  Musculoskeletal: Negative for arthritis, joint pain, muscle cramps, myalgias, neck pain and stiffness.   Gastrointestinal: Negative for abdominal pain, bowel incontinence, diarrhea and excessive appetite.  Genitourinary: Negative for decreased libido, genital sores and incomplete emptying.  Neurological: Negative for brief paralysis, focal weakness, headaches and loss of balance.  Psychiatric/Behavioral: Negative for altered mental status, depression and suicidal ideas.  Allergic/Immunologic: Negative for HIV exposure and persistent infections.    EKGs/Labs/Other Studies Reviewed:    The following studies were reviewed today:   EKG: None today  Coronary CTA February 05, 2021 Aorta:  Normal size.  No calcifications.  No dissection.   Aortic Valve:  Trileaflet.  No calcifications.   Coronary Arteries:  Normal coronary origin.  Right dominance.   RCA is a large dominant artery that gives rise to PDA and PLA. There is no plaque.   Left main is a large artery that gives rise to LAD and LCX arteries.   LAD is a large vessel that has no plaque.   LCX is a non-dominant artery that gives rise to one large OM1 branch. There is no plaque.   Other findings:   Normal pulmonary vein drainage into the left atrium.   Normal left atrial appendage without a thrombus.   Normal size of the pulmonary artery.   There is a small left ventricular diverticulum at basal inferoseptal wall measuring 3.1 x 8.9 mm.   IMPRESSION: 1. Coronary calcium score of 0. This was 0 percentile for age and sex matched control.   2. Normal coronary origin with right dominance.   3. No evidence of CAD. CAD-RADS 0. No evidence of CAD (0%). Consider non-atherosclerotic causes of chest pain.   4. There is a small left ventricular diverticulum at basal inferoseptal wall measuring 3.1 x 8.9 mm.     Electronically Signed   By: Berniece Salines DO   On: 02/05/2021 17:17    Addended by Berniece Salines, DO on 02/05/2021  5:19 PM   Study Result  Narrative & Impression  EXAM: OVER-READ INTERPRETATION  CT CHEST   The  following report is an over-read performed by radiologist Dr. Eben Burow [facility] on 02/05/2021. This over-read does not include interpretation of cardiac or coronary anatomy or pathology. The coronary calcium score/coronary CTA interpretation by the cardiologist is attached.   COMPARISON:  None.   FINDINGS: The visualized portions of the lower lung fields show no suspicious nodules, masses, or infiltrates. No pleural fluid seen.   The visualized portions of the mediastinum and chest wall are unremarkable.   IMPRESSION: No significant non-cardiovascular abnormality in visualized portion of the thorax.   Electronically Signed: By: Marlaine Hind M.D. On: 02/05/2021 16:14      ZIO monitor Patch Wear Time:  6 days and 21 hours starting January 13, 2021. Indication: Palpitations   Patient had a minimum HR of 48  bpm, maximum HR of 169 bpm, and average HR of 67 bpm.   Predominant underlying rhythm was Sinus Rhythm.   1 run of Ventricular Tachycardia occurred lasting 16.3 secs with a maximal rate of 169 bpm (average 138 bpm).   Premature atrial complexes were rare. Premature ventricular complexes were rare.   Symptoms associated with nonsustained ventricular tachycardia.   No pauses, no supraventricular tachycardia and no atrial fibrillation noted.   Conclusion: This study is remarkable for symptomatic nonsustained ventricular tachycardia.  Recent Labs: No results found for requested labs within last 8760 hours.  Recent Lipid Panel No results found for: CHOL, TRIG, HDL, CHOLHDL, VLDL, LDLCALC, LDLDIRECT  Physical Exam:    VS:  BP 134/72 (BP Location: Left Arm, Patient Position: Sitting)   Pulse 82   Ht 5\' 3"  (1.6 m)   Wt 212 lb 12.8 oz (96.5 kg)   SpO2 99%   BMI 37.70 kg/m     Wt Readings from Last 3 Encounters:  05/06/21 212 lb 12.8 oz (96.5 kg)  01/23/21 220 lb (99.8 kg)  01/10/21 219 lb 12.8 oz (99.7 kg)     GEN: Well nourished, well developed  in no acute distress HEENT: Normal NECK: No JVD; No carotid bruits LYMPHATICS: No lymphadenopathy CARDIAC: S1S2 noted,RRR, no murmurs, rubs, gallops RESPIRATORY:  Clear to auscultation without rales, wheezing or rhonchi  ABDOMEN: Soft, non-tender, non-distended, +bowel sounds, no guarding. EXTREMITIES: No edema, No cyanosis, no clubbing MUSCULOSKELETAL:  No deformity  SKIN: Warm and dry NEUROLOGIC:  Alert and oriented x 3, non-focal PSYCHIATRIC:  Normal affect, good insight  ASSESSMENT:    1. Hypertension, unspecified type   2. Mixed hyperlipidemia   3. Obesity (BMI 30-39.9)    PLAN:     1.  She appears to be doing from a cardiovascular standpoint.  No medication changes will be made today. 2.  Blood pressure is acceptable, continue with current antihypertensive regimen. 3.  Hyperlipidemia - continue with current statin medication. 4.  The patient understands the need to lose weight with diet and exercise. We have discussed specific strategies for this.  Overall we talked about all her testing results.  All of her questions has been answered.  No further work-up is required at this time.  The patient is in agreement with the above plan. The patient left the office in stable condition.  The patient will follow up as needed   Medication Adjustments/Labs and Tests Ordered: Current medicines are reviewed at length with the patient today.  Concerns regarding medicines are outlined above.  No orders of the defined types were placed in this encounter.  No orders of the defined types were placed in this encounter.   Patient Instructions  Medication Instructions:  Your physician recommends that you continue on your current medications as directed. Please refer to the Current Medication list given to you today.  *If you need a refill on your cardiac medications before your next appointment, please call your pharmacy*   Lab Work: None If you have labs (blood work) drawn today and  your tests are completely normal, you will receive your results only by: [place] (if you have MyChart) OR A paper copy in the mail If you have any lab test that is abnormal or we need to change your treatment, we will call you to review the results.   Testing/Procedures: None   Follow-Up: At [facility], you and your health needs are our priority.  As part of our continuing mission to provide  you with exceptional heart care, we have created designated Provider Care Teams.  These Care Teams include your primary Cardiologist (physician) and Advanced Practice Providers (APPs -  Physician Assistants and Nurse Practitioners) who all work together to provide you with the care you need, when you need it.  We recommend signing up for the patient portal called "MyChart".  Sign up information is provided on this After Visit Summary.  MyChart is used to connect with patients for Virtual Visits (Telemedicine).  Patients are able to view lab/test results, encounter notes, upcoming appointments, etc.  Non-urgent messages can be sent to your provider as well.   To learn more about what you can do with MyChart, go to [email or web address].    Your next appointment:    As needed  The format for your next appointment:   In Person  Provider:   Berniece Salines, DO [address], [place], [place] [address]    Other Instructions     Adopting a Healthy Lifestyle.  Know what a healthy weight is for you (roughly BMI <25) and aim to maintain this   Aim for 7+ servings of fruits and vegetables daily   65-80+ fluid ounces of water or unsweet tea for healthy kidneys   Limit to max 1 drink of alcohol per day; avoid smoking/tobacco   Limit animal fats in diet for cholesterol and heart health - choose grass fed whenever available   Avoid highly processed foods, and foods high in saturated/trans fats   Aim for low stress - take time to unwind and care for your mental health   Aim for 150  min of moderate intensity exercise weekly for heart health, and weights twice weekly for bone health   Aim for 7-9 hours of sleep daily   When it comes to diets, agreement about the perfect plan isnt easy to find, even among the experts. Experts at the [employer] developed an idea known as the Healthy Eating Plate. Just imagine a plate divided into logical, healthy portions.   The emphasis is on diet quality:   Load up on vegetables and fruits - one-half of your plate: Aim for color and variety, and remember that potatoes dont count.   Go for whole grains - one-quarter of your plate: Whole wheat, barley, wheat berries, quinoa, oats, [place] rice, and foods made with them. If you want pasta, go with whole wheat pasta.   Protein power - one-quarter of your plate: Fish, chicken, beans, and nuts are all healthy, versatile protein sources. Limit red meat.   The diet, however, does go beyond the plate, offering a few other suggestions.   Use healthy plant oils, such as olive, canola, soy, corn, sunflower and peanut. Check the labels, and avoid partially hydrogenated oil, which have unhealthy trans fats.   If youre thirsty, drink water. Coffee and tea are good in moderation, but skip sugary drinks and limit milk and dairy products to one or two daily servings.   The type of carbohydrate in the diet is more important than the amount. Some sources of carbohydrates, such as vegetables, fruits, whole grains, and beans-are healthier than others.   Finally, stay active  Signed, Berniece Salines, DO  05/06/2021 9:52 AM    [place]

## 2021-05-16 DIAGNOSIS — Z23 Encounter for immunization: Secondary | ICD-10-CM | POA: Diagnosis not present

## 2021-05-26 DIAGNOSIS — I10 Essential (primary) hypertension: Secondary | ICD-10-CM | POA: Diagnosis not present

## 2021-05-26 DIAGNOSIS — G43109 Migraine with aura, not intractable, without status migrainosus: Secondary | ICD-10-CM | POA: Diagnosis not present

## 2021-05-28 DIAGNOSIS — G4700 Insomnia, unspecified: Secondary | ICD-10-CM | POA: Diagnosis not present

## 2021-05-28 DIAGNOSIS — I10 Essential (primary) hypertension: Secondary | ICD-10-CM | POA: Diagnosis not present

## 2021-05-28 DIAGNOSIS — G43009 Migraine without aura, not intractable, without status migrainosus: Secondary | ICD-10-CM | POA: Diagnosis not present

## 2021-07-03 DIAGNOSIS — K219 Gastro-esophageal reflux disease without esophagitis: Secondary | ICD-10-CM | POA: Diagnosis not present

## 2021-07-03 DIAGNOSIS — R519 Headache, unspecified: Secondary | ICD-10-CM | POA: Diagnosis not present

## 2021-07-03 DIAGNOSIS — I10 Essential (primary) hypertension: Secondary | ICD-10-CM | POA: Diagnosis not present

## 2021-07-03 DIAGNOSIS — F17210 Nicotine dependence, cigarettes, uncomplicated: Secondary | ICD-10-CM | POA: Diagnosis not present

## 2021-07-03 DIAGNOSIS — G4730 Sleep apnea, unspecified: Secondary | ICD-10-CM | POA: Diagnosis not present

## 2021-07-03 DIAGNOSIS — Z79899 Other long term (current) drug therapy: Secondary | ICD-10-CM | POA: Diagnosis not present

## 2021-07-03 DIAGNOSIS — F419 Anxiety disorder, unspecified: Secondary | ICD-10-CM | POA: Diagnosis not present

## 2021-07-03 DIAGNOSIS — G43009 Migraine without aura, not intractable, without status migrainosus: Secondary | ICD-10-CM | POA: Diagnosis not present

## 2021-07-03 DIAGNOSIS — Z853 Personal history of malignant neoplasm of breast: Secondary | ICD-10-CM | POA: Diagnosis not present

## 2021-07-10 DIAGNOSIS — I10 Essential (primary) hypertension: Secondary | ICD-10-CM | POA: Diagnosis not present

## 2021-07-10 DIAGNOSIS — F17200 Nicotine dependence, unspecified, uncomplicated: Secondary | ICD-10-CM | POA: Diagnosis not present

## 2021-07-10 DIAGNOSIS — R519 Headache, unspecified: Secondary | ICD-10-CM | POA: Diagnosis not present

## 2021-07-10 DIAGNOSIS — R0683 Snoring: Secondary | ICD-10-CM | POA: Diagnosis not present

## 2021-09-10 DIAGNOSIS — I10 Essential (primary) hypertension: Secondary | ICD-10-CM | POA: Diagnosis not present

## 2021-09-10 DIAGNOSIS — Z853 Personal history of malignant neoplasm of breast: Secondary | ICD-10-CM | POA: Diagnosis not present

## 2021-09-10 DIAGNOSIS — Z0000 Encounter for general adult medical examination without abnormal findings: Secondary | ICD-10-CM | POA: Diagnosis not present

## 2021-09-10 DIAGNOSIS — N939 Abnormal uterine and vaginal bleeding, unspecified: Secondary | ICD-10-CM | POA: Diagnosis not present

## 2021-09-10 DIAGNOSIS — F17200 Nicotine dependence, unspecified, uncomplicated: Secondary | ICD-10-CM | POA: Diagnosis not present

## 2021-09-24 DIAGNOSIS — N921 Excessive and frequent menstruation with irregular cycle: Secondary | ICD-10-CM | POA: Diagnosis not present

## 2021-10-27 DIAGNOSIS — Z3202 Encounter for pregnancy test, result negative: Secondary | ICD-10-CM | POA: Diagnosis not present

## 2021-10-27 DIAGNOSIS — N921 Excessive and frequent menstruation with irregular cycle: Secondary | ICD-10-CM | POA: Diagnosis not present

## 2021-10-31 DIAGNOSIS — Z302 Encounter for sterilization: Secondary | ICD-10-CM | POA: Diagnosis not present

## 2021-10-31 DIAGNOSIS — D282 Benign neoplasm of uterine tubes and ligaments: Secondary | ICD-10-CM | POA: Diagnosis not present

## 2021-10-31 DIAGNOSIS — N921 Excessive and frequent menstruation with irregular cycle: Secondary | ICD-10-CM | POA: Diagnosis not present

## 2021-10-31 DIAGNOSIS — N858 Other specified noninflammatory disorders of uterus: Secondary | ICD-10-CM | POA: Diagnosis not present

## 2021-10-31 DIAGNOSIS — D259 Leiomyoma of uterus, unspecified: Secondary | ICD-10-CM | POA: Diagnosis not present

## 2021-11-10 DIAGNOSIS — Z853 Personal history of malignant neoplasm of breast: Secondary | ICD-10-CM | POA: Diagnosis not present

## 2021-11-10 DIAGNOSIS — Z08 Encounter for follow-up examination after completed treatment for malignant neoplasm: Secondary | ICD-10-CM | POA: Diagnosis not present

## 2021-11-10 DIAGNOSIS — R922 Inconclusive mammogram: Secondary | ICD-10-CM | POA: Diagnosis not present

## 2022-01-11 IMAGING — CT CT HEART MORP W/ CTA COR W/ SCORE W/ CA W/CM &/OR W/O CM
4 of 7 series · 8 of 20 positions shown, 9 images · IV contrast (APPLIED)
Comparison: None.
COMPARISON: None.

Addendum:
EXAM:
OVER-READ INTERPRETATION  CT CHEST

The following report is an over-read performed by radiologist Dr.
does not include interpretation of cardiac or coronary anatomy or
pathology. The coronary calcium score/coronary CTA interpretation by
the cardiologist is attached.
CLINICAL DATA: This is a 45 year old female with chest pain.
Cardiac/Coronary  CTA
TECHNIQUE: The patient was scanned on a [employer] Force scanner.

[Series 6: best diast 71 % · axial · 0.37mm/px · z∈[-175,-140]mm · 2 of 266 slices shown]
[im 89/266  vessel]
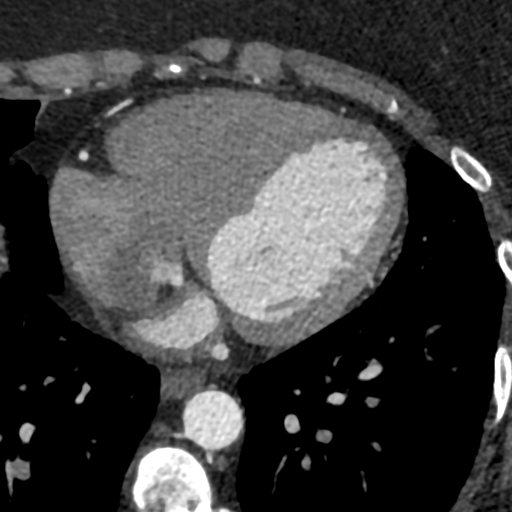
[im 177/266  vessel]
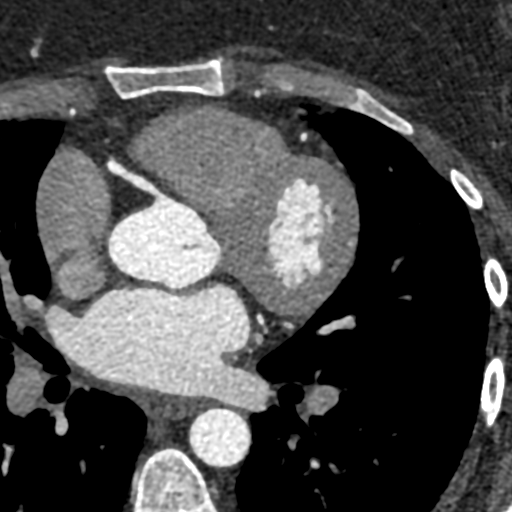

[Series 7: best syst · axial · 0.37mm/px · z∈[-175,-140]mm · 2 of 266 slices shown, 3 images]
[im 89/266  vessel]
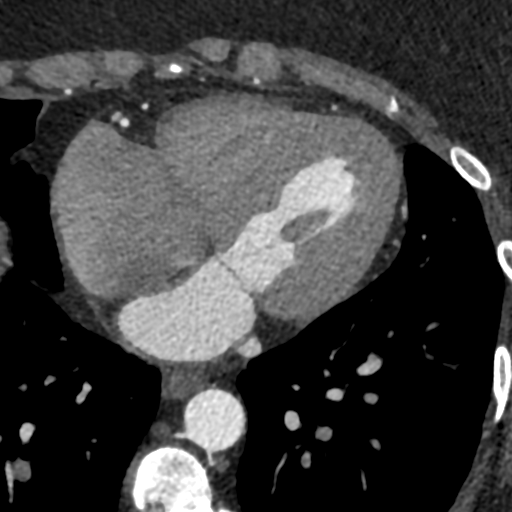
[im 89/266  lung]
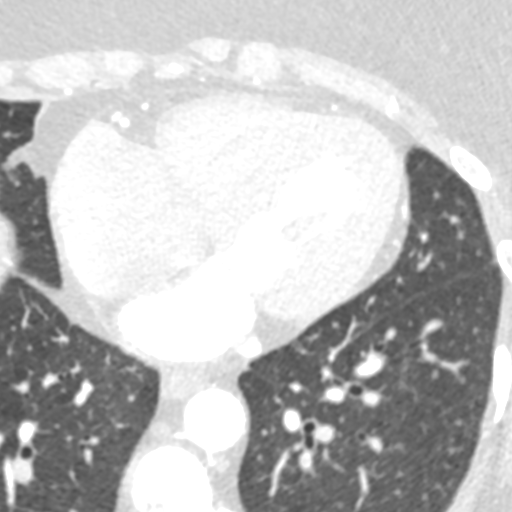
[im 177/266  vessel]
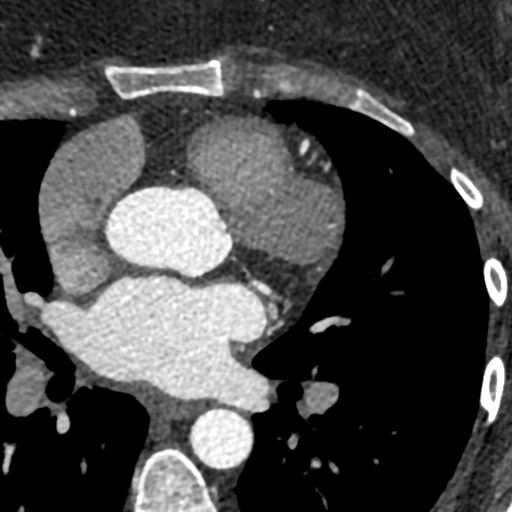

[Series 8: ts diast sharp 71 % · axial · 0.37mm/px · z∈[-175,-140]mm · 2 of 266 slices shown]
[im 89/266  lung]
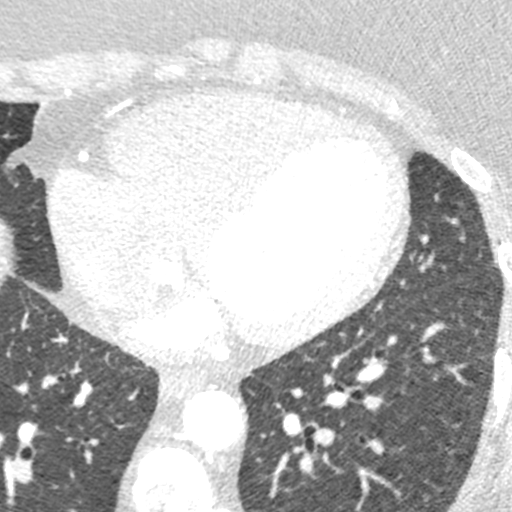
[im 177/266  lung]
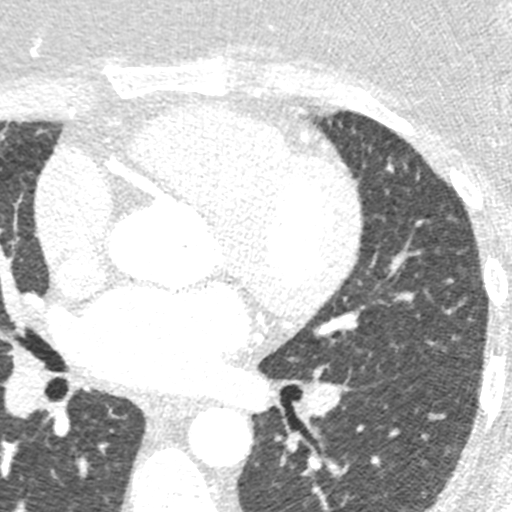

[Series 9: ts syst sharp · axial · 0.37mm/px · z∈[-175,-140]mm · 2 of 266 slices shown]
[im 89/266  lung]
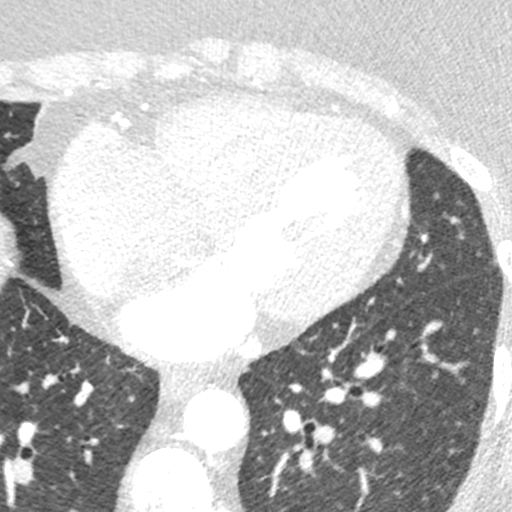
[im 177/266  lung]
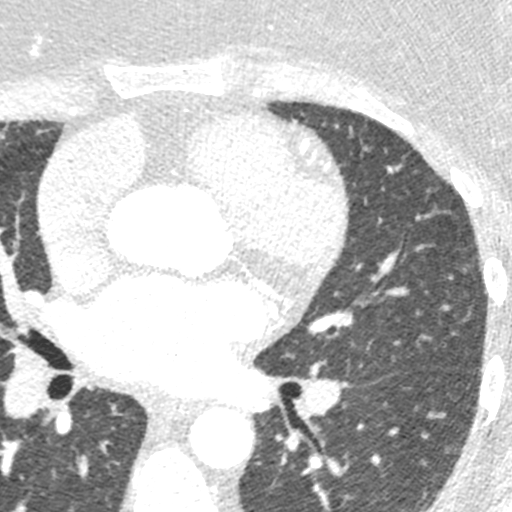

[8 of 20 positions shown; findings below may reference images not displayed]

FINDINGS: The visualized portions of the lower lung fields show no suspicious
nodules, masses, or infiltrates. No pleural fluid seen.

The visualized portions of the mediastinum and chest wall are
unremarkable.
IMPRESSION: No significant non-cardiovascular abnormality in visualized portion
of the thorax.
FINDINGS: A 100 kV prospective scan was triggered in the descending thoracic
aorta at 111 HU's. Axial non-contrast 3 mm slices were carried out
through the heart. The data set was analyzed on a dedicated work
station and scored using the Agatson method. Gantry rotation speed
was 250 msecs and collimation was .6 mm. No beta blockade and 0.8 mg
of sl NTG was given. The 3D data set was reconstructed in 5%
intervals of the 67-82 % of the R-R cycle. Diastolic phases were
analyzed on a dedicated work station using MPR, MIP and VRT modes.
The patient received 80 cc of contrast.

Aorta:  Normal size.  No calcifications.  No dissection.

Aortic Valve:  Trileaflet.  No calcifications.

Coronary Arteries:  Normal coronary origin.  Right dominance.

RCA is a large dominant artery that gives rise to PDA and PLA. There
is no plaque.

Left main is a large artery that gives rise to LAD and LCX arteries.

LAD is a large vessel that has no plaque.

LCX is a non-dominant artery that gives rise to one large OM1
branch. There is no plaque.

Other findings:

Normal pulmonary vein drainage into the left atrium.

Normal left atrial appendage without a thrombus.

Normal size of the pulmonary artery.

There is a small left ventricular diverticulum at basal inferoseptal
wall measuring 3.1 x 8.9 mm.
IMPRESSION: 1. Coronary calcium score of 0. This was 0 percentile for age and
sex matched control.

2. Normal coronary origin with right dominance.

3. No evidence of CAD. CAD-RADS 0. No evidence of CAD (0%). Consider
non-atherosclerotic causes of chest pain.

4. There is a small left ventricular diverticulum at basal
inferoseptal wall measuring 3.1 x 8.9 mm.

*** End of Addendum ***
EXAM:
OVER-READ INTERPRETATION  CT CHEST

The following report is an over-read performed by radiologist Dr.
does not include interpretation of cardiac or coronary anatomy or
pathology. The coronary calcium score/coronary CTA interpretation by
the cardiologist is attached.
FINDINGS: The visualized portions of the lower lung fields show no suspicious
nodules, masses, or infiltrates. No pleural fluid seen.

The visualized portions of the mediastinum and chest wall are
unremarkable.
IMPRESSION: No significant non-cardiovascular abnormality in visualized portion
of the thorax.

## 2022-01-19 DIAGNOSIS — I10 Essential (primary) hypertension: Secondary | ICD-10-CM | POA: Diagnosis not present

## 2022-01-19 DIAGNOSIS — D500 Iron deficiency anemia secondary to blood loss (chronic): Secondary | ICD-10-CM | POA: Diagnosis not present

## 2022-01-19 DIAGNOSIS — F32A Depression, unspecified: Secondary | ICD-10-CM | POA: Diagnosis not present

## 2022-01-19 DIAGNOSIS — F419 Anxiety disorder, unspecified: Secondary | ICD-10-CM | POA: Diagnosis not present

## 2022-01-19 DIAGNOSIS — N939 Abnormal uterine and vaginal bleeding, unspecified: Secondary | ICD-10-CM | POA: Diagnosis not present

## 2022-04-09 ENCOUNTER — Telehealth: Payer: Self-pay

## 2022-04-09 NOTE — Patient Outreach (Signed)
  Care Coordination   04/09/2022 Name: Kristen Russell MRN: [id number] DOB: 1975/08/23   Care Coordination Outreach Attempts:  An unsuccessful telephone outreach was attempted today to offer the patient information about available care coordination services as a benefit of their health plan.   Follow Up Plan:  Additional outreach attempts will be made to offer the patient care coordination information and services.   Encounter Outcome:  No Answer  Care Coordination Interventions Activated:  No   Care Coordination Interventions:  No, not indicated    Tomasa Rand, RN, BSN, CEN [facility] [employer] [phone]

## 2022-05-05 DIAGNOSIS — R079 Chest pain, unspecified: Secondary | ICD-10-CM | POA: Diagnosis not present

## 2022-05-05 DIAGNOSIS — F32A Depression, unspecified: Secondary | ICD-10-CM | POA: Diagnosis not present

## 2022-05-05 DIAGNOSIS — D509 Iron deficiency anemia, unspecified: Secondary | ICD-10-CM | POA: Diagnosis not present

## 2022-05-05 DIAGNOSIS — Z0000 Encounter for general adult medical examination without abnormal findings: Secondary | ICD-10-CM | POA: Diagnosis not present

## 2022-05-05 DIAGNOSIS — Z23 Encounter for immunization: Secondary | ICD-10-CM | POA: Diagnosis not present

## 2022-05-05 DIAGNOSIS — F419 Anxiety disorder, unspecified: Secondary | ICD-10-CM | POA: Diagnosis not present

## 2022-06-12 ENCOUNTER — Telehealth: Payer: Self-pay

## 2022-06-12 NOTE — Patient Outreach (Signed)
  Care Coordination   06/12/2022 Name: Kristen Russell MRN: [id number] DOB: September 03, 1975   Care Coordination Outreach Attempts:  A second unsuccessful outreach was attempted today to offer the patient with information about available care coordination services as a benefit of their health plan.     Follow Up Plan:  Additional outreach attempts will be made to offer the patient care coordination information and services.   Encounter Outcome:  No Answer  Care Coordination Interventions Activated:  No   Care Coordination Interventions:  No, not indicated    Tomasa Rand, RN, BSN, CEN [facility] [employer] [phone]

## 2022-06-18 ENCOUNTER — Telehealth: Payer: Self-pay

## 2022-06-18 NOTE — Patient Outreach (Deleted)
  Care Coordination   06/18/2022 Name: Kristen Russell MRN: [id number] DOB: [birth year]   Care Coordination Outreach Attempts:  A second unsuccessful outreach was attempted today to offer the patient with information about available care coordination services as a benefit of their health plan.     Follow Up Plan:  Additional outreach attempts will be made to offer the patient care coordination information and services.   Encounter Outcome:  No Answer  Care Coordination Interventions Activated:  No   Care Coordination Interventions:  No, not indicated    Tomasa Rand, RN, BSN, CEN [facility] [employer] [phone]

## 2022-06-18 NOTE — Patient Outreach (Signed)
  Care Coordination   06/18/2022 Name: Kristen Russell MRN: [id number] DOB: Jan 14, 1976   Care Coordination Outreach Attempts:  A third unsuccessful outreach was attempted today to offer the patient with information about available care coordination services as a benefit of their health plan.   Follow Up Plan:  No further outreach attempts will be made at this time. We have been unable to contact the patient to offer or enroll patient in care coordination services  Encounter Outcome:  No Answer  Care Coordination Interventions Activated:  No   Care Coordination Interventions:  No, not indicated    Tomasa Rand, RN, BSN, CEN [place] Coord[facility] [phone]
